# Patient Record
Sex: Male | Born: 2000 | Hispanic: No | Marital: Single | Smoking: Never smoker
Health system: Southern US, Community
[De-identification: ages and names within clinical notes are randomized; demographics above are authoritative.]

## PROBLEM LIST (undated history)

## (undated) DIAGNOSIS — F419 Anxiety disorder, unspecified: Secondary | ICD-10-CM

---
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
## 2019-07-30 ENCOUNTER — Ambulatory Visit: Payer: Self-pay | Attending: Family

## 2019-07-30 DIAGNOSIS — Z23 Encounter for immunization: Secondary | ICD-10-CM | POA: Insufficient documentation

## 2019-07-30 NOTE — Progress Notes (Signed)
   Covid-19 Vaccination Clinic  Name:  Kent Herring    MRN: [id number] DOB: Sep 27, 2000  07/30/2019  [name] was observed post Covid-19 immunization for 15 minutes without incident. He was provided with Vaccine Information Sheet and instruction to access the V-Safe system.   [name] was instructed to call 911 with any severe reactions post vaccine: Marland Kitchen Difficulty breathing  . Swelling of face and throat  . A fast heartbeat  . A bad rash all over body  . Dizziness and weakness   Immunizations Administered    Name Date Dose VIS Date Route   Moderna COVID-19 Vaccine 07/30/2019 12:54 PM 0.5 mL 04/28/2019 Intramuscular   Manufacturer: Moderna   Lot: [id number]   NDC: 00349-179-15

## 2019-09-01 ENCOUNTER — Ambulatory Visit: Payer: Self-pay | Attending: Family

## 2019-09-01 DIAGNOSIS — Z23 Encounter for immunization: Secondary | ICD-10-CM

## 2019-09-01 NOTE — Progress Notes (Signed)
   Covid-19 Vaccination Clinic  Name:  Kent Herring    MRN: [id number] DOB: 08/27/2000  09/01/2019  [name] was observed post Covid-19 immunization for 15 minutes without incident. He was provided with Vaccine Information Sheet and instruction to access the V-Safe system.   [name] was instructed to call 911 with any severe reactions post vaccine: Marland Kitchen Difficulty breathing  . Swelling of face and throat  . A fast heartbeat  . A bad rash all over body  . Dizziness and weakness   Immunizations Administered    Name Date Dose VIS Date Route   Moderna COVID-19 Vaccine 09/01/2019  1:02 PM 0.5 mL 04/28/2019 Intramuscular   Manufacturer: Moderna   Lot: [id number]   NDC: 76184-859-27

## 2020-06-06 ENCOUNTER — Other Ambulatory Visit: Payer: Self-pay

## 2020-06-14 ENCOUNTER — Ambulatory Visit: Payer: Self-pay

## 2020-06-16 ENCOUNTER — Ambulatory Visit: Payer: Self-pay | Attending: Internal Medicine

## 2020-06-16 DIAGNOSIS — Z23 Encounter for immunization: Secondary | ICD-10-CM

## 2020-06-16 NOTE — Progress Notes (Signed)
   Covid-19 Vaccination Clinic  Name:  Kent Herring    MRN: [id number] DOB: April 15, 2001  06/16/2020  [name] was observed post Covid-19 immunization for 15 minutes without incident. He was provided with Vaccine Information Sheet and instruction to access the V-Safe system.   [name] was instructed to call 911 with any severe reactions post vaccine: Marland Kitchen Difficulty breathing  . Swelling of face and throat  . A fast heartbeat  . A bad rash all over body  . Dizziness and weakness   Immunizations Administered    Name Date Dose VIS Date Route   Moderna Covid-19 Booster Vaccine 06/16/2020  2:57 PM 0.25 mL 03/16/2020 Intramuscular   Manufacturer: Moderna   Lot: [id number]   NDC: 84210-312-81

## 2020-07-11 ENCOUNTER — Other Ambulatory Visit: Payer: Self-pay

## 2020-07-13 ENCOUNTER — Other Ambulatory Visit: Payer: Self-pay

## 2020-07-14 ENCOUNTER — Other Ambulatory Visit: Payer: Self-pay

## 2020-10-23 ENCOUNTER — Emergency Department (HOSPITAL_COMMUNITY): Payer: No Typology Code available for payment source

## 2020-10-23 ENCOUNTER — Other Ambulatory Visit: Payer: Self-pay

## 2020-10-23 ENCOUNTER — Encounter (HOSPITAL_COMMUNITY): Payer: Self-pay | Admitting: Emergency Medicine

## 2020-10-23 ENCOUNTER — Emergency Department (HOSPITAL_COMMUNITY)
Admission: EM | Admit: 2020-10-23 | Discharge: 2020-10-23 | Disposition: A | Discharge status: Home / Self Care | Payer: No Typology Code available for payment source | Attending: Emergency Medicine | Admitting: Emergency Medicine

## 2020-10-23 DIAGNOSIS — Z23 Encounter for immunization: Secondary | ICD-10-CM | POA: Insufficient documentation

## 2020-10-23 DIAGNOSIS — S0181XA Laceration without foreign body of other part of head, initial encounter: Secondary | ICD-10-CM | POA: Diagnosis not present

## 2020-10-23 HISTORY — DX: Anxiety disorder, unspecified: F41.9

## 2020-10-23 MED ORDER — TETANUS-DIPHTH-ACELL PERTUSSIS 5-2.5-18.5 LF-MCG/0.5 IM SUSY
0.5000 mL | PREFILLED_SYRINGE | Freq: Once | INTRAMUSCULAR | Status: AC
  Administered 2020-10-23: 0.5 mL via INTRAMUSCULAR
  Filled 2020-10-23: qty 0.5

## 2020-10-23 MED ORDER — HYDROCODONE-ACETAMINOPHEN 5-325 MG PO TABS
1.0000 | ORAL_TABLET | Freq: Once | ORAL | Status: AC
  Administered 2020-10-23: 1 via ORAL
  Filled 2020-10-23: qty 1

## 2020-10-23 MED ORDER — LIDOCAINE-EPINEPHRINE 2 %-1:200000 IJ SOLN
10.0000 mL | Freq: Once | INTRAMUSCULAR | Status: AC
Start: 2020-10-23 — End: 2020-10-23
  Administered 2020-10-23: 10 mL via INTRADERMAL
  Filled 2020-10-23: qty 20
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
## 2020-10-23 NOTE — Discharge Instructions (Signed)
Follow-up closely with your normal doctor. Alternate ibuprofen and Tylenol for pain and swelling.  Ice will really help as well. Apply ice 10 minutes every hour for the first 24 hours. Sleeping on extra pillows will help and not swell as badly. You need sunscreen on the wound every time you got the sun for the first year to reduce scar formation. Watch for signs of infection like we talked about.
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
## 2020-10-23 NOTE — ED Notes (Addendum)
Patient transported to CT via CT tech 
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
## 2020-10-23 NOTE — ED Notes (Signed)
Pt to CT

## 2022-07-05 IMAGING — CT CT ORBITS W/O CM
3 of 6 series · 11 of 47 positions shown, 13 images · non-contrast
Comparison: No pertinent prior exam.

CLINICAL DATA: Facial trauma

EXAM:
CT ORBITS WITHOUT CONTRAST
TECHNIQUE: Multidetector CT imaging of the orbits was performed using the
standard protocol without intravenous contrast. Multiplanar CT image
reconstructions were also generated.

[Series 3: orbits 2.0 hr40 3 st · axial · 0.28mm/px · z∈[-16,+32]mm · 6 of 34 slices shown, 8 images]
[im 5/34  brain]
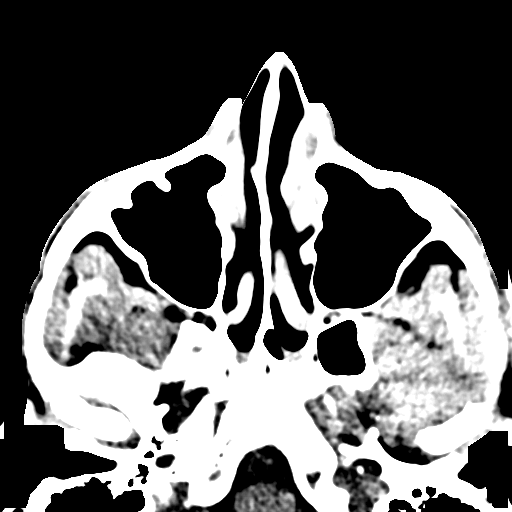
[im 5/34  bone]
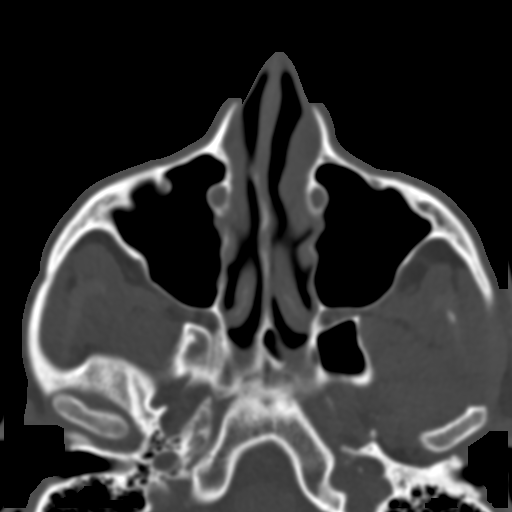
[im 10/34  bone]
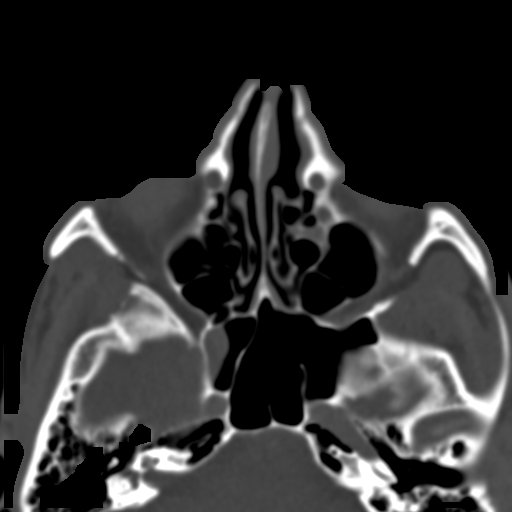
[im 15/34  bone]
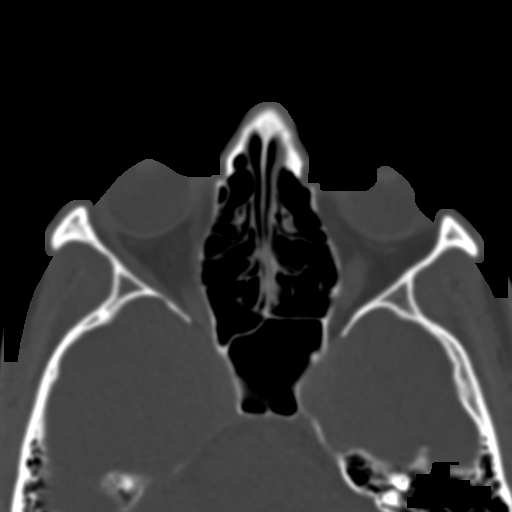
[im 19/34  bone]
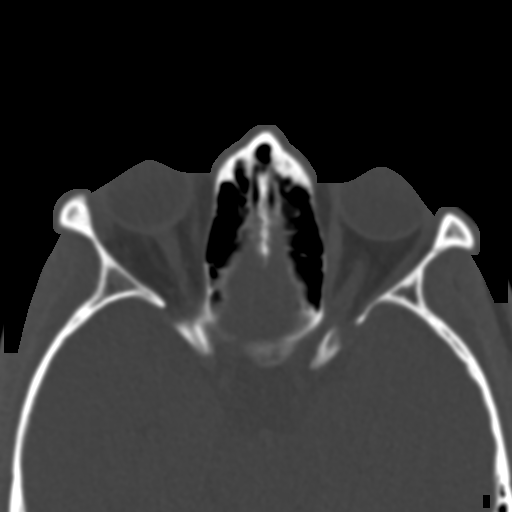
[im 24/34  brain]
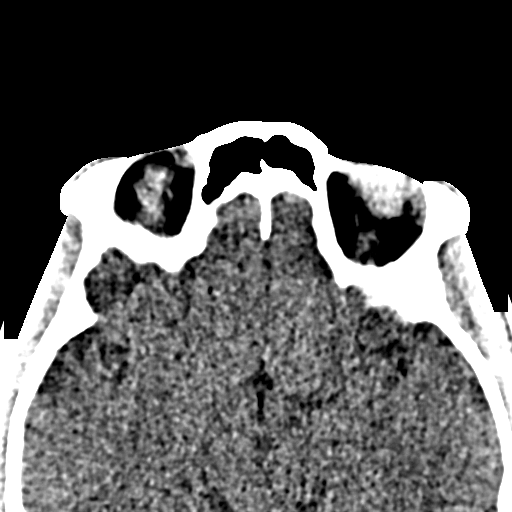
[im 24/34  bone]
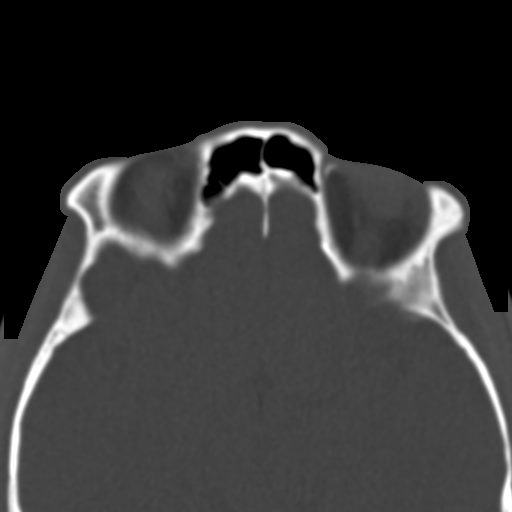
[im 29/34  bone]
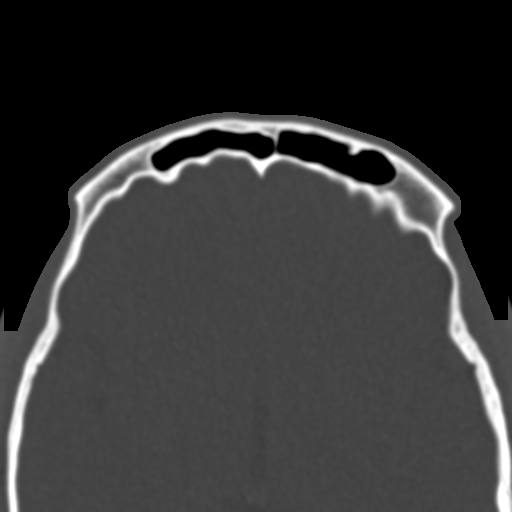

[Series 6: bone cor · coronal · 0.12mm/px · 3 of 61 slices shown]
[im 16/61  bone]
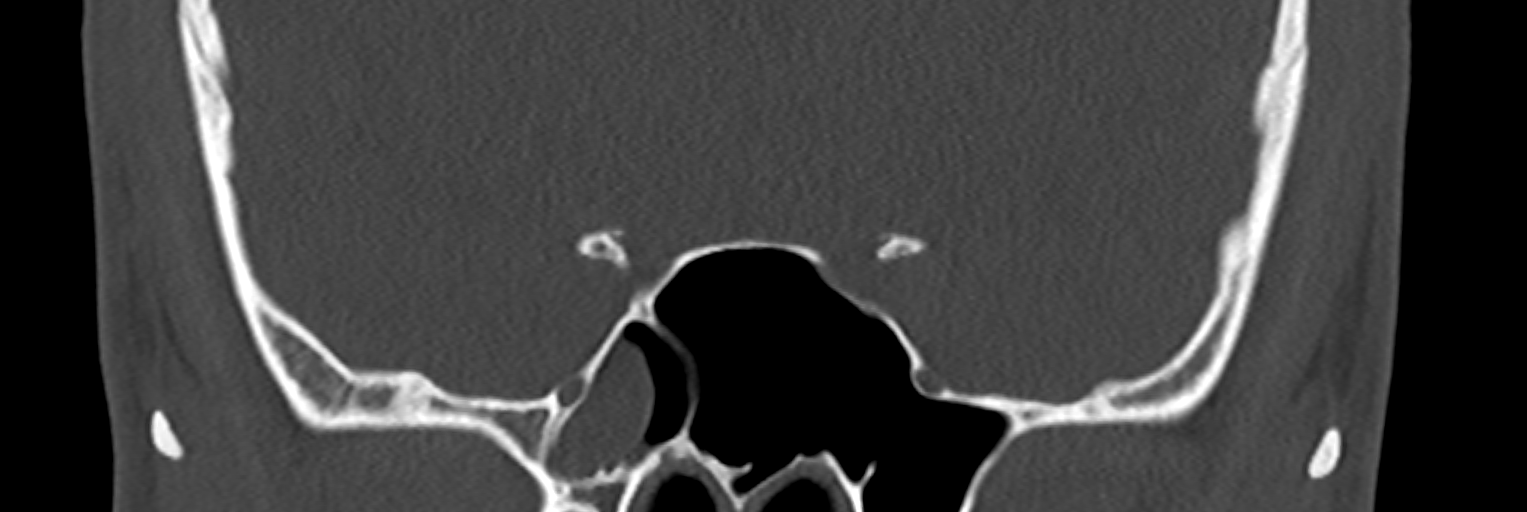
[im 31/61  bone]
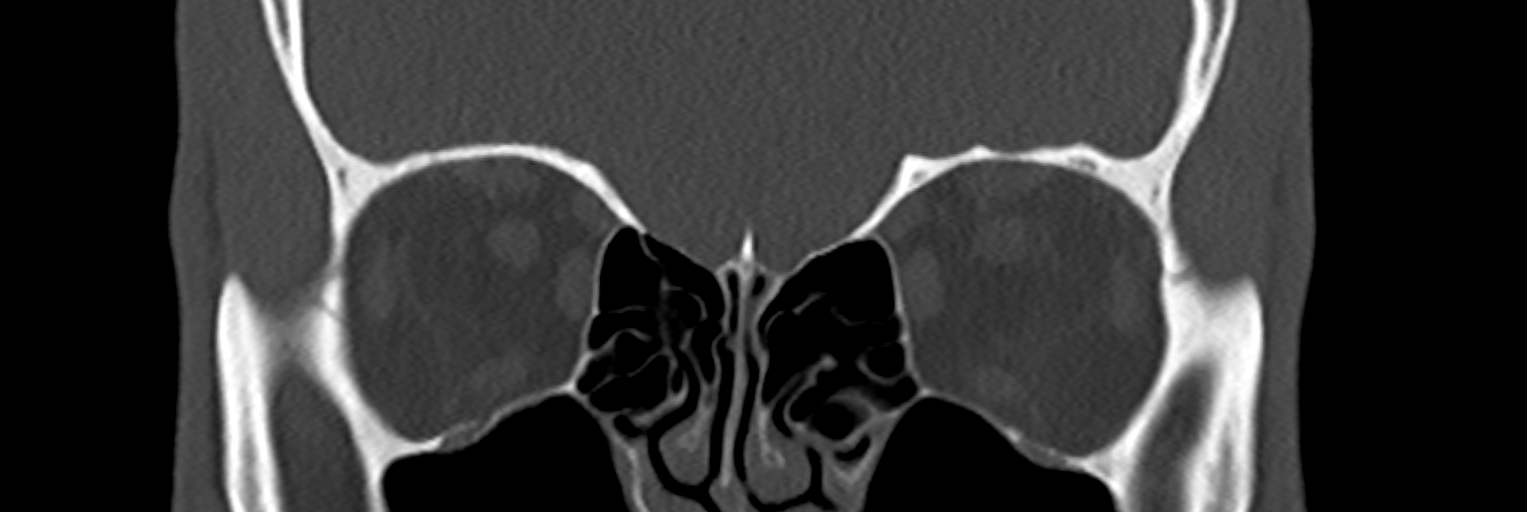
[im 46/61  bone]
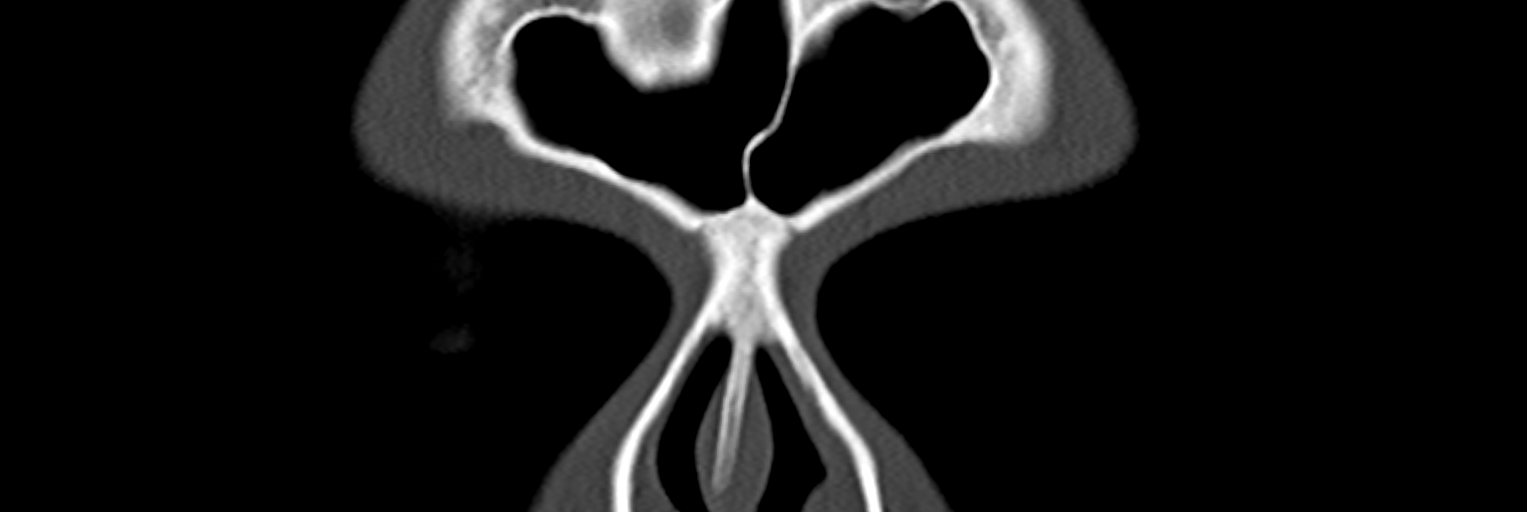

[Series 8: bone sag · sagittal · 0.13mm/px · 2 of 87 slices shown]
[im 29/87  bone]
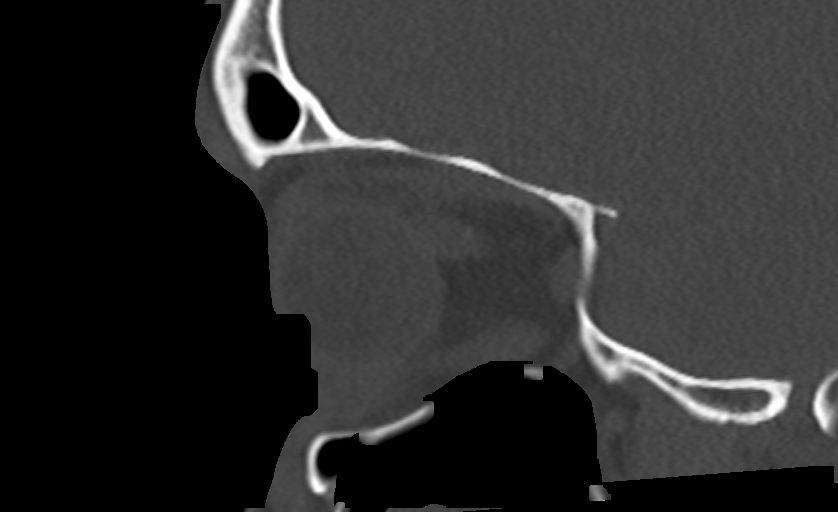
[im 58/87  bone]
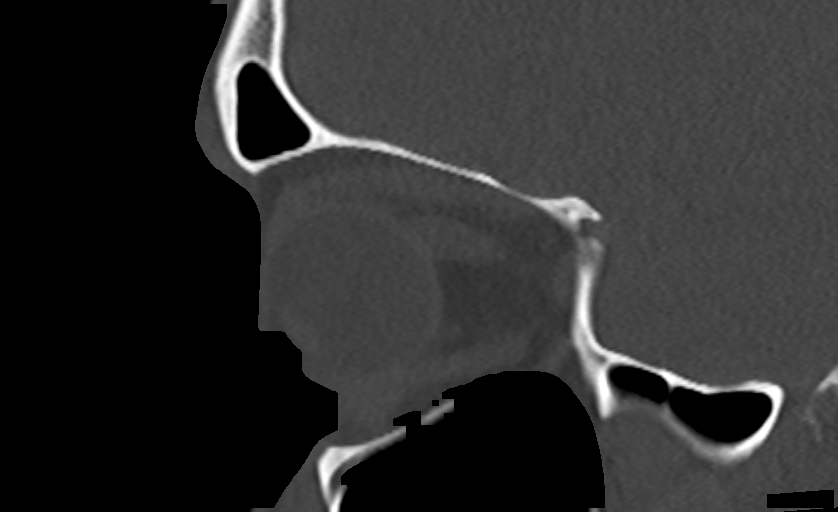

[11 of 47 positions shown; findings below may reference images not displayed]

FINDINGS: Orbits: No orbital mass or evidence of inflammation. Normal
appearance of the globes, optic nerve-sheath complexes, extraocular
muscles, orbital fat and lacrimal glands.

Visible paranasal sinuses: Mucosal thickening of the RIGHT maxillary
sinus. Polypoid mucosal thickening of the RIGHT sphenoid sinus.

Soft tissues: Laceration of the RIGHT forehead.

Osseous: No fracture or aggressive lesion.

Limited intracranial: No acute or significant finding.
IMPRESSION: RIGHT forehead laceration without acute facial bone fracture.
